# Patient Record
Sex: Female | Born: 1965 | Race: Black or African American | Hispanic: No | Marital: Married | Smoking: Never smoker
Health system: Southern US, Community
[De-identification: ages and names within clinical notes are randomized; demographics above are authoritative.]

---

## 1997-05-13 ENCOUNTER — Inpatient Hospital Stay (HOSPITAL_COMMUNITY): Admission: AD | Admit: 1997-05-13 | Discharge: 1997-05-13 | Payer: Self-pay | Admitting: *Deleted

## 1997-05-15 ENCOUNTER — Inpatient Hospital Stay (HOSPITAL_COMMUNITY): Admission: AD | Admit: 1997-05-15 | Discharge: 1997-05-18 | Payer: Self-pay | Admitting: *Deleted

## 1997-07-19 ENCOUNTER — Emergency Department (HOSPITAL_COMMUNITY): Admission: EM | Admit: 1997-07-19 | Discharge: 1997-07-19 | Payer: Self-pay | Admitting: Emergency Medicine

## 1997-08-15 ENCOUNTER — Inpatient Hospital Stay (HOSPITAL_COMMUNITY): Admission: AD | Admit: 1997-08-15 | Discharge: 1997-08-15 | Payer: Self-pay | Admitting: *Deleted

## 1999-01-26 ENCOUNTER — Emergency Department (HOSPITAL_COMMUNITY): Admission: EM | Admit: 1999-01-26 | Discharge: 1999-01-26 | Payer: Self-pay | Admitting: Emergency Medicine

## 1999-06-30 ENCOUNTER — Emergency Department (HOSPITAL_COMMUNITY): Admission: EM | Admit: 1999-06-30 | Discharge: 1999-06-30 | Payer: Self-pay

## 2000-09-16 ENCOUNTER — Inpatient Hospital Stay (HOSPITAL_COMMUNITY): Admission: AD | Admit: 2000-09-16 | Discharge: 2000-09-18 | Payer: Self-pay | Admitting: Obstetrics

## 2000-09-20 ENCOUNTER — Encounter: Admission: RE | Admit: 2000-09-20 | Discharge: 2000-10-20 | Payer: Self-pay | Admitting: Obstetrics

## 2001-01-16 ENCOUNTER — Emergency Department (HOSPITAL_COMMUNITY): Admission: EM | Admit: 2001-01-16 | Discharge: 2001-01-16 | Payer: Self-pay | Admitting: Emergency Medicine

## 2005-01-25 ENCOUNTER — Encounter: Admission: RE | Admit: 2005-01-25 | Discharge: 2005-01-25 | Payer: Self-pay | Admitting: Occupational Medicine

## 2006-08-20 IMAGING — CR DG KNEE COMPLETE 4+V*L*
4 series · 4 of 4 positions shown · non-contrast
Comparison: none

CLINICAL DATA: Left knee injury with pivoting. Popping.  Knee pain. 
 LEFT KNEE - 4 VIEW: 
 There is no evidence of fracture, dislocation, or joint effusion.  There is no evidence of arthropathy or other focal bone abnormality.  Soft tissues are unremarkable.

[view not recorded (1 of 4)]
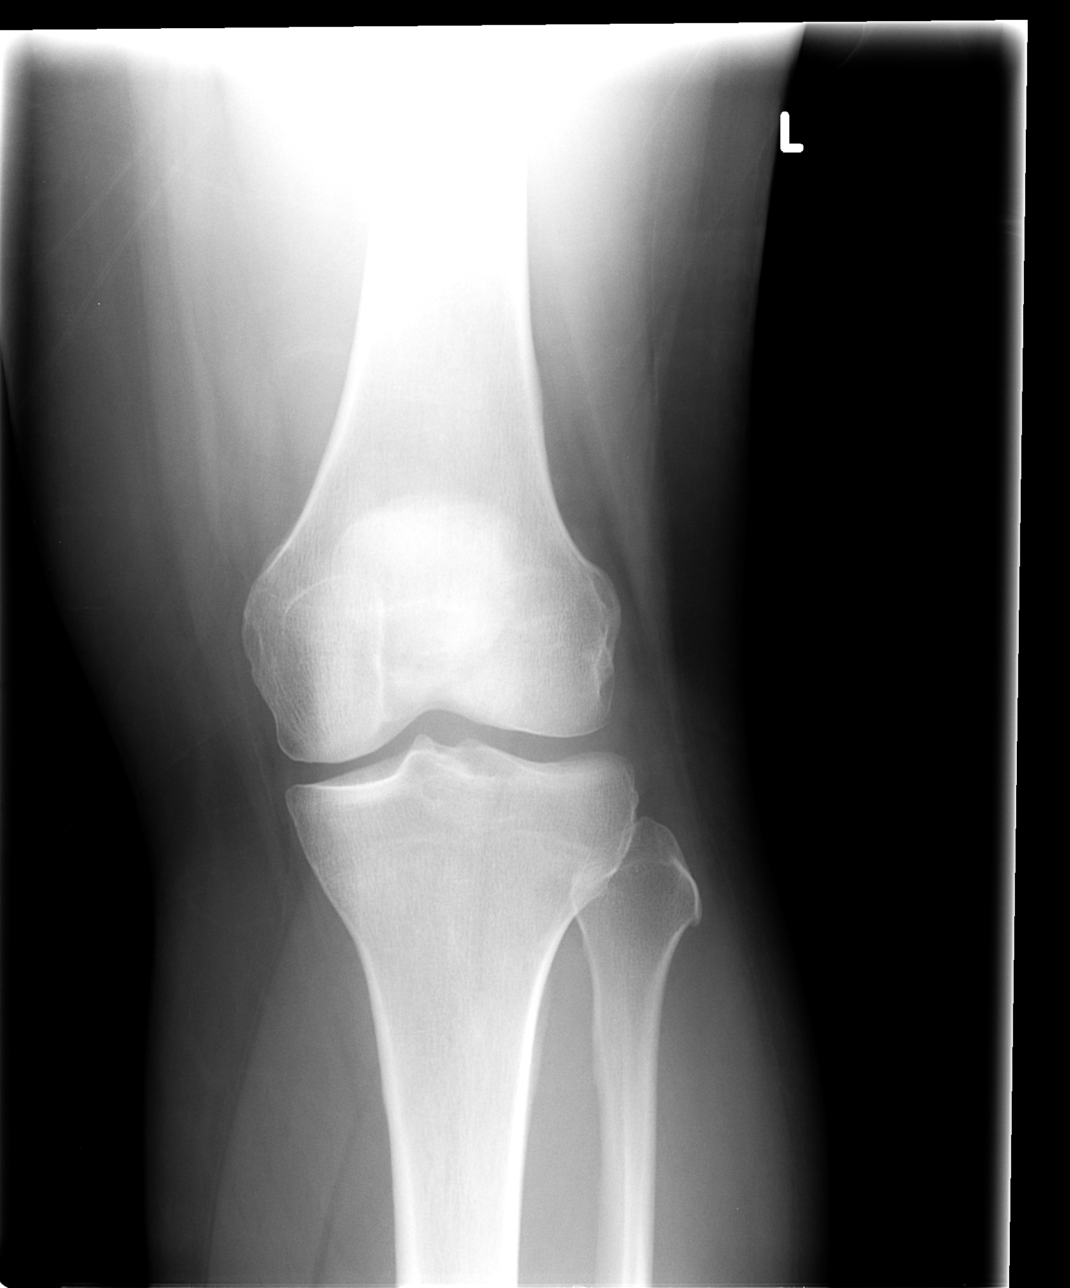

[view not recorded (2 of 4)]
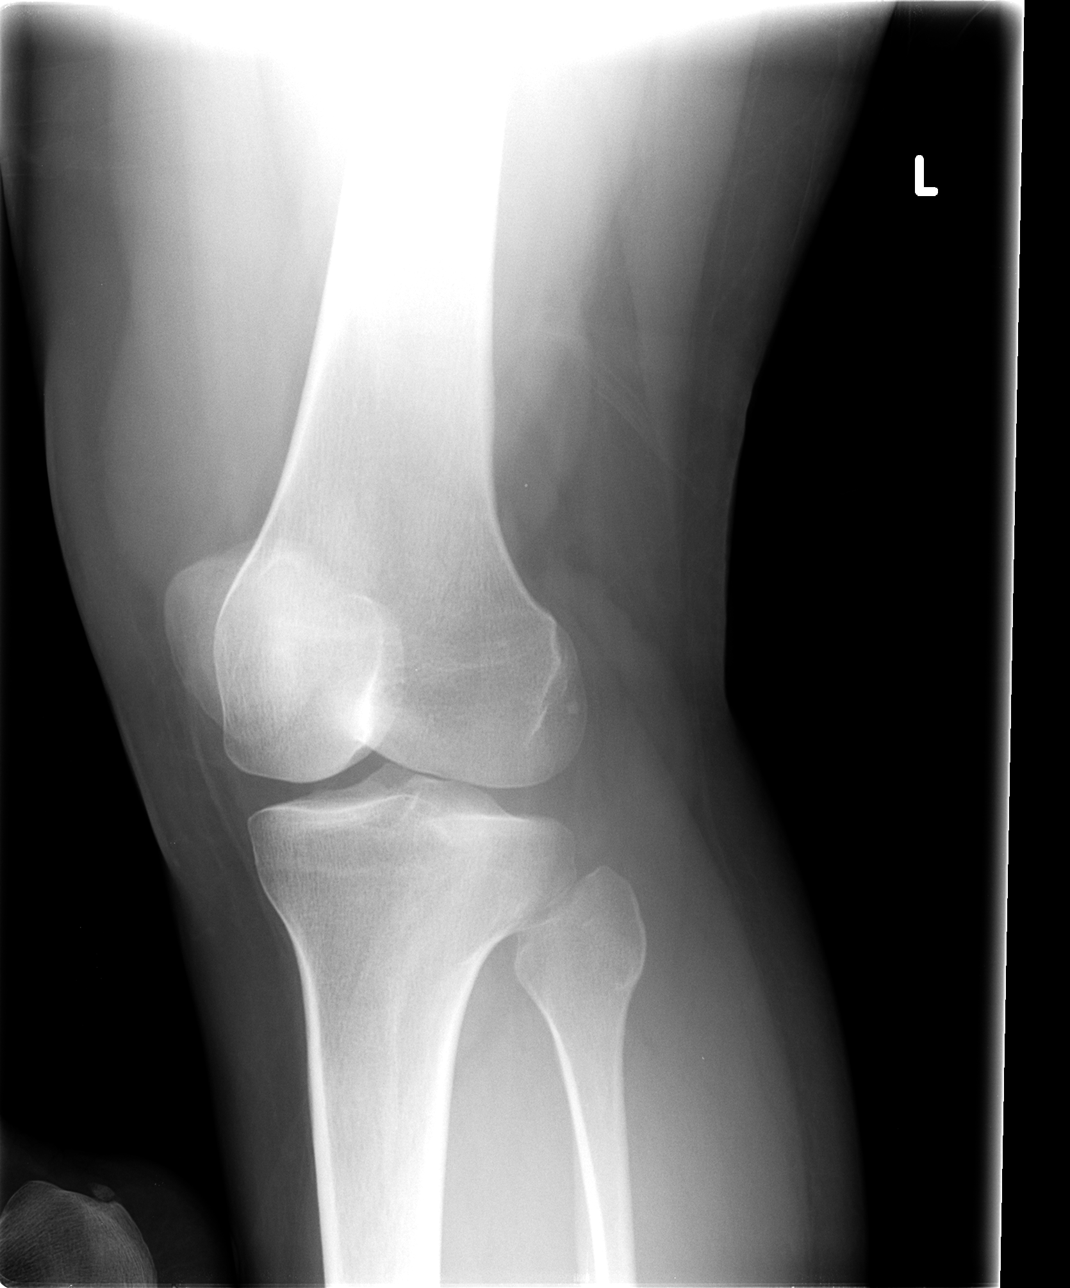

[view not recorded (3 of 4)]
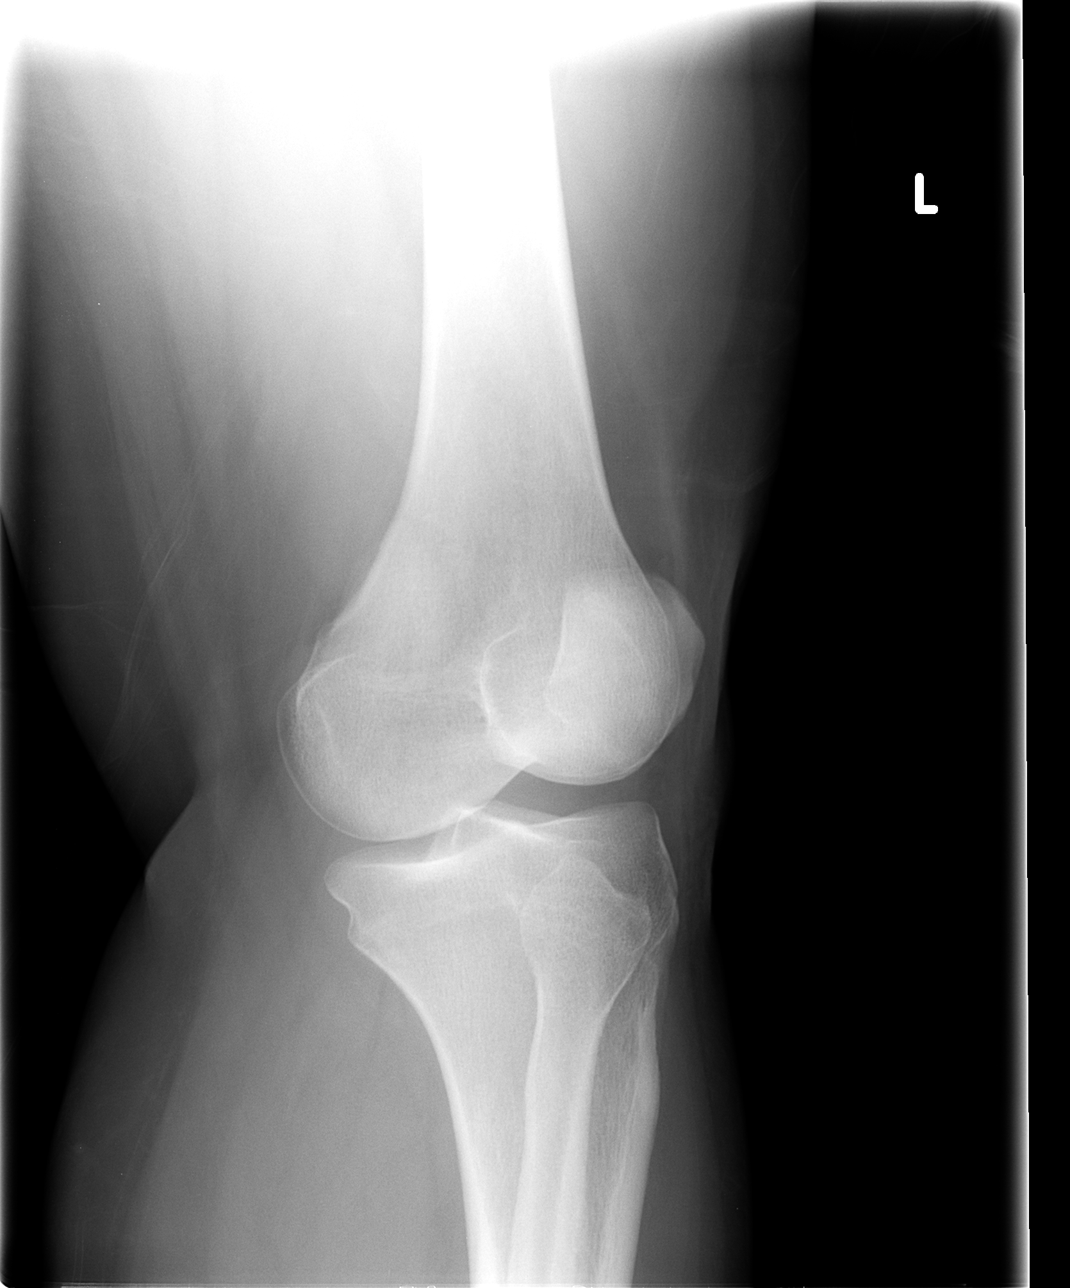

[view not recorded (4 of 4)]
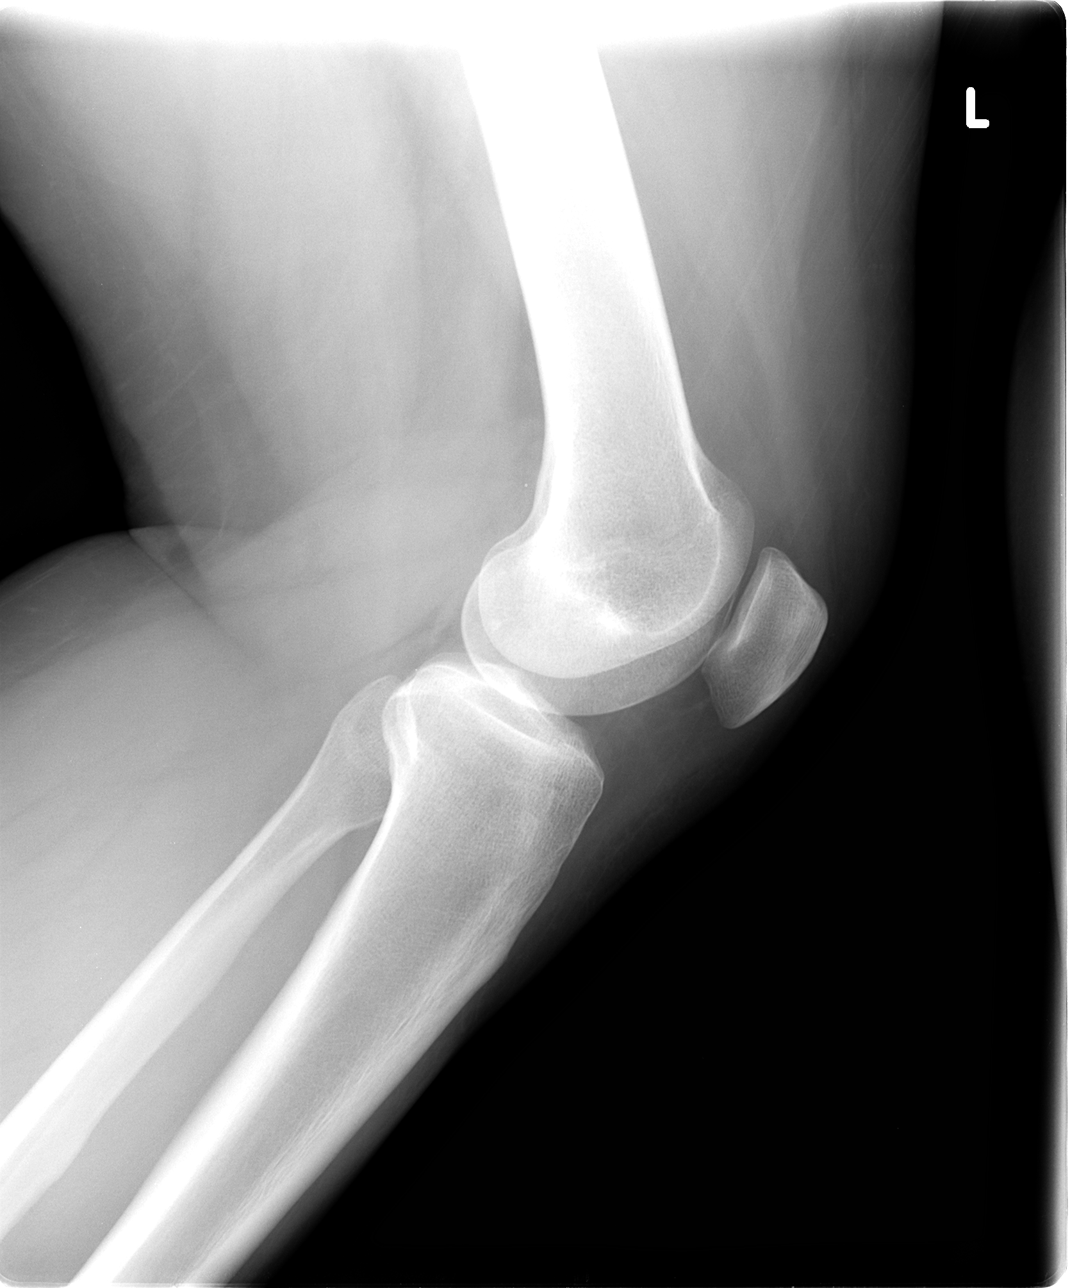

[4 of 4 positions shown; findings below may reference images not displayed]

IMPRESSION: Negative.

## 2015-02-25 ENCOUNTER — Encounter (HOSPITAL_COMMUNITY): Payer: Self-pay | Admitting: Emergency Medicine

## 2015-02-25 ENCOUNTER — Emergency Department (HOSPITAL_COMMUNITY)
Admission: EM | Admit: 2015-02-25 | Discharge: 2015-02-25 | Disposition: A | Discharge status: Home / Self Care | Payer: BLUE CROSS/BLUE SHIELD | Attending: Emergency Medicine | Admitting: Emergency Medicine

## 2015-02-25 DIAGNOSIS — J029 Acute pharyngitis, unspecified: Secondary | ICD-10-CM | POA: Diagnosis not present

## 2015-02-25 DIAGNOSIS — R05 Cough: Secondary | ICD-10-CM | POA: Diagnosis present

## 2015-02-25 DIAGNOSIS — R6889 Other general symptoms and signs: Secondary | ICD-10-CM

## 2015-02-25 MED ORDER — OSELTAMIVIR PHOSPHATE 75 MG PO CAPS: 75.0000 mg | ORAL_CAPSULE | Freq: Two times a day (BID) | ORAL | Status: AC

## 2015-02-25 NOTE — Discharge Instructions (Signed)
Take the prescribed medication as directed.  You may also take tylenol/motrin to help with bodyaches, fever, etc. Make sure to rest and drink fluids. Follow-up with your primary care physician. Return to the ED for new or worsening symptoms.

## 2015-02-25 NOTE — ED Notes (Signed)
Per pt, states headache, body aches since yesterday

## 2015-02-25 NOTE — ED Notes (Signed)
Discharge delayed by registration

## 2015-02-25 NOTE — ED Notes (Signed)
Called x 1 to treatment area

## 2015-02-25 NOTE — ED Provider Notes (Signed)
CSN: [id number]     Arrival date & time 02/25/15  1419 History  By signing my name below, I, Rohini Rajnarayanan, attest that this documentation has been prepared under the direction and in the presence of Sharilyn Sites, PA-C Electronically Signed: Charlean Merl, ED Scribe 02/25/2015 at 4:11 PM.   Chief Complaint  Patient presents with  . flu like symptoms    The history is provided by the patient. No language interpreter was used.  HPI Comments: Chelsea Hawkins is a 50 y.o. female who presents to the Emergency Department complaining of body URI type symptoms. Specifically patient has had sore throat, dry cough, nasal congestion, and generalized body aches since yesterday. Patient states her husband was recently sick with a cold over the weekend. Patient is also a [employer] loss [employer]er, but denies any sick contacts in her classes. She denies any chest pain or shortness of breath. Denies any fever, chills, or sweats. Patient did receive a flu vaccination this year.  No nausea, vomiting.  Has continued eating and drinking normally.  VSS.  History reviewed. No pertinent past medical history. History reviewed. No pertinent past surgical history. No family history on file. Social History  Substance Use Topics  . Smoking status: Never Smoker   . Smokeless tobacco: None  . Alcohol Use: No   OB History    No data available     Review of Systems  HENT: Positive for sore throat.   Respiratory: Positive for cough.   Musculoskeletal: Positive for myalgias.  Neurological: Positive for headaches.  All other systems reviewed and are negative.  Allergies  Review of patient's allergies indicates not on file.  Home Medications   Prior to Admission medications   Medication Sig Start Date End Date Taking? Authorizing Provider  oseltamivir (TAMIFLU) 75 MG capsule Take 1 capsule (75 mg total) by mouth every 12 (twelve) hours. 02/25/15   Garlon Hatchet, PA-C   BP 138/86 mmHg  Pulse 94  Temp(Src) 98.7  F (37.1 C) (Oral)  Resp 18  SpO2 97%  LMP 02/04/2015 Physical Exam  Constitutional: She is oriented to person, place, and time. She appears well-developed and well-nourished. No distress.  HENT:  Head: Normocephalic and atraumatic.  Right Ear: Tympanic membrane and external ear normal.  Left Ear: Tympanic membrane and external ear normal.  Nose: Nose normal.  Mouth/Throat: Uvula is midline, oropharynx is clear and moist and mucous membranes are normal. No oropharyngeal exudate, posterior oropharyngeal edema, posterior oropharyngeal erythema or tonsillar abscesses.  Eyes: Conjunctivae and EOM are normal. Pupils are equal, round, and reactive to light.  Neck: Normal range of motion and full passive range of motion without pain. Neck supple. No rigidity.  No rigidity, no meningismus  Cardiovascular: Normal rate, regular rhythm and normal heart sounds.   No murmur heard. Pulmonary/Chest: Effort normal and breath sounds normal. No respiratory distress. She has no wheezes. She has no rhonchi.  Abdominal: Soft. Bowel sounds are normal. There is no tenderness. There is no guarding.  Musculoskeletal: Normal range of motion. She exhibits no edema.  Neurological: She is alert and oriented to person, place, and time. She has normal strength. She displays no tremor. No cranial nerve deficit or sensory deficit. She displays no seizure activity.  AAOx3, answering questions appropriately; equal strength UE and LE bilaterally; CN grossly intact; moves all extremities appropriately without ataxia; no focal neuro deficits or facial asymmetry appreciated  Skin: Skin is warm and dry. No rash noted. She is not diaphoretic.  Psychiatric: She has  a normal mood and affect. Her behavior is normal. Thought content normal.  Nursing note and vitals reviewed.   ED Course  Procedures  DIAGNOSTIC STUDIES: Oxygen Saturation is 97% on RA, adequate by my interpretation.    COORDINATION OF CARE: 4:03 PM-Discussed  treatment plan with pt at bedside and pt agreed to plan.   Labs Review Labs Reviewed - No data to display  Imaging Review No results found. I have personally reviewed and evaluated these images and lab results as part of my medical decision-making.   EKG Interpretation None      MDM   Final diagnoses:  Flu-like symptoms   50 year old female here with flu like symptoms for approximately 24 hours. Patient is afebrile, nontoxic. Her exam is benign. She denies any chest pain or shortness of breath. She has had sick contacts with similar symptoms. Headache is without neurologic deficits or clinical manifestations of meningitis.  This is likely a viral process, possibly flu.  Given that patient is within 48 hours of onset, she is a candidate for Tamiflu. I discussed this option with her and she would like to try it. Encouraged rest, fluids, and other supportive care home. Follow-up with primary care physician.  Discussed plan with patient, he/she acknowledged understanding and agreed with plan of care.  Return precautions given for new or worsening symptoms.  I personally performed the services described in this documentation, which was scribed in my presence. The recorded information has been reviewed and is accurate.  Garlon Hatchet, PA-C 02/25/15 1701  Tilden Fossa, MD 02/28/15 [phone]
